# Patient Record
Sex: Male | Born: 1975 | Race: White | Hispanic: No | Marital: Married | Smoking: Former smoker
Health system: Southern US, Community
[De-identification: ages and names within clinical notes are randomized; demographics above are authoritative.]

## PROBLEM LIST (undated history)

## (undated) DIAGNOSIS — E119 Type 2 diabetes mellitus without complications: Secondary | ICD-10-CM

## (undated) DIAGNOSIS — I10 Essential (primary) hypertension: Secondary | ICD-10-CM

## (undated) HISTORY — DX: Essential (primary) hypertension: I10

## (undated) HISTORY — DX: Type 2 diabetes mellitus without complications: E11.9

---

## 2004-08-25 ENCOUNTER — Other Ambulatory Visit: Payer: Self-pay

## 2004-08-25 ENCOUNTER — Emergency Department: Payer: Self-pay | Admitting: Emergency Medicine

## 2007-08-09 ENCOUNTER — Ambulatory Visit: Payer: Self-pay | Admitting: Family Medicine

## 2007-08-20 ENCOUNTER — Ambulatory Visit (HOSPITAL_BASED_OUTPATIENT_CLINIC_OR_DEPARTMENT_OTHER): Admission: RE | Admit: 2007-08-20 | Discharge: 2007-08-20 | Payer: Self-pay | Admitting: Orthopedic Surgery

## 2008-06-11 ENCOUNTER — Ambulatory Visit: Payer: Self-pay | Admitting: Family Medicine

## 2009-04-24 HISTORY — PX: FRACTURE SURGERY: SHX138

## 2009-05-05 ENCOUNTER — Emergency Department: Payer: Self-pay | Admitting: Emergency Medicine

## 2009-05-10 ENCOUNTER — Ambulatory Visit: Payer: Self-pay | Admitting: Unknown Physician Specialty

## 2009-05-12 ENCOUNTER — Ambulatory Visit: Payer: Self-pay | Admitting: Unknown Physician Specialty

## 2010-09-06 NOTE — Op Note (Signed)
Kenneth Caldwell, Kenneth Caldwell                  ACCOUNT NO.:  [id number]   MEDICAL RECORD NO.:  [id number]          PATIENT TYPE:  AMB   LOCATION:  DSC                          FACILITY:  [facility]   PHYSICIAN:  Leonides Grills, M.D.     DATE OF BIRTH:  Nov 27, 1975   DATE OF PROCEDURE:  08/20/2007  DATE OF DISCHARGE:                               OPERATIVE REPORT   PREOPERATIVE DIAGNOSIS:  Right anterolateral and posterolateral ankle  impingement.   POSTOPERATIVE DIAGNOSIS:  Right anterolateral and posterolateral ankle  impingement.   OPERATION:  Right ankle arthroscopy with extensive debridement.   ANESTHESIA:  General.   SURGEON:  Leonides Grills, MD.   ASSISTANT:  Richardean Canal, PA-C.   ESTIMATED BLOOD LOSS:  Minimal.   TOURNIQUET:  None.   COMPLICATIONS:  None.   DISPOSITION:  Stable to PR.   INDICATIONS:  This 35 year old male who was injured at work and has had  persistent longstanding anterolateral and posterolateral ankle pain that  was interfering with his life.  He is consented for the above procedure.  All risks including infection or vessel injury, persistent pain  worsening pain, prolonged recovery, stiffness, arthritis were all  explained.  Questions were encouraged and answered.   OPERATION:  The patient was brought to the operating room, placed in  supine position after adequate general endotracheal tube anesthesia was  administered, as well as Ancef 1 g IV piggyback.  The patient was then  placed in a sloppy lateral position with the operative side up on a  beanbag.  All bony prominences were well padded.  The right lower  extremity was then prepped and draped in a sterile manner.  No  tourniquet was used.  Anatomical landmarks include anterior tibialis  tendon.  Peroneus tertius tendon were mapped out.  Superficial peroneal  nerve could not be seen.  Spinal needle was then placed just medial to  the anterior tibialis tendon and 20 mL of normal saline was instilled in  the  ankle.  The nick-and-spread technique was then utilized to create  the anteromedial portal and then a blunt tip trocar with cannula  followed by camera was placed in the ankle.  Direct visualization of the  anterolateral portal was created with a spinal needle followed by nick-  and-spread technique created lateral to the peroneus tertius tendon  illuminating the skin from inside out to make sure that the superficial  peroneal nerve was not in this interval.  There was a tremendous amount  of synovitis anterolaterally with a lot of scar tissue as well.  It  appears that there was a injury in this area and formed scar right over  the accessory tib-fib ligament area.  Synovitis extended into the  lateral gutter and anteriorly.  With a shaver as well as a  radiofrequency bevel, this was extensively debrided.  Once this was  debrided, we then placed the scope anterolaterally, visualizing  anteromedially and there was a small patch of synovitis in this area as  well which was also debrided.  The medial talar dome and medial gutter  were  sent to pathology.  With maximum dorsiflexion there was nothing  impinging anteriorly.  We then visualized posterolaterally and we were  able to visualize this without doing a posterolateral portal.  There was  a large flap of scar tissue from the syndesmotic recess that was  pinching the posterolateral aspect of the talar dome.  This was then  debrided with a shaver, as well as a radiofrequency bevel  range of  motion of the ankle, there was no impingement once this was done.  There  was actually a flap of tissue that was flapping in this area with a  patch of synovitis.  Once this was done, range of motion of the ankle  had no areas of impingement.  Tissues were obtained throughout the  procedure.  The camera was removed.  The wound was closed with 4-0 nylon  stitch.  Sterile dressing was applied.  CAM Walker boot was applied.  The patient was stable to the  PR.      Leonides Grills, M.D.  Electronically Signed     PB/MEDQ  D:  08/20/2007  T:  08/21/2007  Job:  [id number]

## 2011-01-17 LAB — BASIC METABOLIC PANEL
BUN: 10
Creatinine, Ser: 0.73
GFR calc non Af Amer: >60
Glucose, Bld: 132 — ABNORMAL HIGH
Potassium: 5.2 — ABNORMAL HIGH

## 2012-12-11 ENCOUNTER — Encounter: Payer: Self-pay | Admitting: General Surgery

## 2012-12-11 ENCOUNTER — Ambulatory Visit (INDEPENDENT_AMBULATORY_CARE_PROVIDER_SITE_OTHER): Payer: 59 | Admitting: General Surgery

## 2012-12-11 VITALS — BP 130/80 | HR 88 | Resp 14 | Ht 67.0 in | Wt 293.0 lb

## 2012-12-11 DIAGNOSIS — K645 Perianal venous thrombosis: Secondary | ICD-10-CM

## 2012-12-11 NOTE — Patient Instructions (Addendum)
May use Hemorrhoid cream as needed for an additional week

## 2012-12-11 NOTE — Progress Notes (Signed)
Patient ID: Kenneth Rakes., male   DOB: [birth year], 37 y.o.   MRN: [id number]  Chief Complaint  Patient presents with  . Rectal Problems    hemorrhoids    HPI Kenneth Offerdahl. is a 37 y.o. male.  Patient here today for evaluation of hemorrhoids. States he had some rectal pain on Monday and then noticed them bleeding worse on Tuesday like "on his legs and in toilet bowel".  He went to [facility] on Tuesday and was prescribed suppositories and pain medication.  States he is using the Tucks pads and witch hazel as well.  Has noticed some bleeding on the toilet tissue with the hemorrhoids in the past but never this bad. States he has BM 2-3 times a day which is normal. Denies constipation. States the bleeding is not as bad now.  HPI  Past Medical History  Diagnosis Date  . Diabetes mellitus without complication   . Hypertension     Past Surgical History  Procedure Laterality Date  . Fracture surgery  2011    leg    History reviewed. No pertinent family history.  Social History History  Substance Use Topics  . Smoking status: Former Smoker    Quit date: 04/25/1999  . Smokeless tobacco: Never Used  . Alcohol Use: Yes     Comment: 1-2/week    No Known Allergies  Current Outpatient Prescriptions  Medication Sig Dispense Refill  . ANUCORT-HC 25 MG suppository       . glipiZIDE (GLUCOTROL XL) 2.5 MG 24 hr tablet Take 2.5 mg by mouth daily.       Marland Kitchen lisinopril-hydrochlorothiazide (PRINZIDE,ZESTORETIC) 20-12.5 MG per tablet Take 1 tablet by mouth daily.       . pioglitazone (ACTOS) 15 MG tablet Take 15 mg by mouth daily.       No current facility-administered medications for this visit.    Review of Systems Review of Systems  Constitutional: Negative.   Respiratory: Negative.   Cardiovascular: Negative.   Gastrointestinal: Positive for anal bleeding and rectal pain.    Blood pressure 130/80, pulse 88, resp. rate 14, height 5\' 7"  (1.702 m), weight 293 lb (132.904  kg).  Physical Exam Physical Exam  Constitutional: He is oriented to person, place, and time. He appears well-developed and well-nourished.  Cardiovascular: Normal rate and regular rhythm.   Pulmonary/Chest: Effort normal and breath sounds normal.  Abdominal: Soft. No hernia.  Neurological: He is alert and oriented to person, place, and time.  Skin: Skin is warm and dry.  thrombosed hemorrhoid which has already drained on the right side. Digital exam is normal.  Data Reviewed    Assessment    Resolving thrombosed external hemorrhoid     Plan    OTC hemorrhoid cream for another week. No other treatment required        SANKAR,SEEPLAPUTHUR G 12/12/2012, 8:11 AM

## 2012-12-12 ENCOUNTER — Encounter: Payer: Self-pay | Admitting: General Surgery

## 2012-12-12 DIAGNOSIS — K645 Perianal venous thrombosis: Secondary | ICD-10-CM | POA: Insufficient documentation

## 2013-03-12 ENCOUNTER — Ambulatory Visit: Payer: Self-pay | Admitting: Surgery

## 2013-03-12 DIAGNOSIS — Z01810 Encounter for preprocedural cardiovascular examination: Secondary | ICD-10-CM

## 2013-03-12 LAB — MAGNESIUM: Magnesium: 2 mg/dL

## 2013-03-12 LAB — CBC WITH DIFFERENTIAL/PLATELET
Basophil %: 0.3 %
Eosinophil #: 0.1 10*3/uL (ref 0.0–0.7)
HGB: 14.3 g/dL (ref 13.0–18.0)
Lymphocyte #: 2.6 10*3/uL (ref 1.0–3.6)
MCV: 85 fL (ref 80–100)
Monocyte #: 0.6 x10 3/mm (ref 0.2–1.0)
Monocyte %: 7.8 %
Neutrophil #: 4.9 10*3/uL (ref 1.4–6.5)

## 2013-03-12 LAB — PROTIME-INR: INR: 1

## 2013-03-12 LAB — IRON AND TIBC
Iron Bind.Cap.(Total): 345 ug/dL (ref 250–450)
Iron: 59 ug/dL — ABNORMAL LOW (ref 65–175)
Unbound Iron-Bind.Cap.: 286 ug/dL

## 2013-03-12 LAB — COMPREHENSIVE METABOLIC PANEL
Alkaline Phosphatase: 97 U/L (ref 50–136)
Anion Gap: 7 (ref 7–16)
BUN: 14 mg/dL (ref 7–18)
Co2: 29 mmol/L (ref 21–32)
Creatinine: 0.67 mg/dL (ref 0.60–1.30)
Potassium: 3.7 mmol/L (ref 3.5–5.1)
SGOT(AST): 14 U/L — ABNORMAL LOW (ref 15–37)
SGPT (ALT): 19 U/L (ref 12–78)
Sodium: 138 mmol/L (ref 136–145)

## 2013-03-12 LAB — AMYLASE: Amylase: 40 U/L (ref 25–115)

## 2013-03-12 LAB — LIPASE, BLOOD: Lipase: 187 U/L (ref 73–393)

## 2013-03-12 LAB — HEMOGLOBIN A1C: Hemoglobin A1C: 10 % — ABNORMAL HIGH (ref 4.2–6.3)

## 2013-04-04 ENCOUNTER — Ambulatory Visit: Payer: Self-pay | Admitting: Surgery

## 2013-04-09 ENCOUNTER — Ambulatory Visit: Payer: Self-pay | Admitting: Surgery

## 2013-04-24 ENCOUNTER — Ambulatory Visit: Payer: Self-pay | Admitting: Surgery

## 2013-04-24 HISTORY — PX: LAPAROSCOPIC GASTRIC SLEEVE RESECTION: SHX5895

## 2013-12-24 ENCOUNTER — Telehealth: Payer: Self-pay | Admitting: *Deleted

## 2013-12-24 NOTE — Telephone Encounter (Signed)
error 

## 2014-12-01 ENCOUNTER — Other Ambulatory Visit: Payer: Self-pay | Admitting: Occupational Medicine

## 2014-12-01 ENCOUNTER — Ambulatory Visit: Payer: Self-pay

## 2014-12-01 DIAGNOSIS — M25561 Pain in right knee: Secondary | ICD-10-CM

## 2015-09-29 ENCOUNTER — Encounter: Payer: Self-pay | Admitting: Urology

## 2015-09-29 ENCOUNTER — Other Ambulatory Visit: Payer: Self-pay

## 2015-09-29 ENCOUNTER — Ambulatory Visit (INDEPENDENT_AMBULATORY_CARE_PROVIDER_SITE_OTHER): Payer: BLUE CROSS/BLUE SHIELD | Admitting: Urology

## 2015-09-29 VITALS — BP 143/88 | HR 75 | Ht 67.0 in | Wt 228.7 lb

## 2015-09-29 DIAGNOSIS — B379 Candidiasis, unspecified: Secondary | ICD-10-CM | POA: Diagnosis not present

## 2015-09-29 DIAGNOSIS — R310 Gross hematuria: Secondary | ICD-10-CM

## 2015-09-29 LAB — MICROSCOPIC EXAMINATION
Bacteria, UA: NONE SEEN
RBC, UA: >30 /hpf — AB (ref 0–?)
WBC, UA: NONE SEEN /hpf (ref 0–?)

## 2015-09-29 LAB — URINALYSIS, COMPLETE
Bilirubin, UA: NEGATIVE
Glucose, UA: NEGATIVE
LEUKOCYTES UA: NEGATIVE
Nitrite, UA: NEGATIVE
PH UA: 5.5 (ref 5.0–7.5)
Specific Gravity, UA: >1.03 — ABNORMAL HIGH (ref 1.005–1.030)
Urobilinogen, Ur: 2 mg/dL — ABNORMAL HIGH (ref 0.2–1.0)

## 2015-09-29 MED ORDER — FLUCONAZOLE 100 MG PO TABS: 100.0000 mg | ORAL_TABLET | Freq: Every day | ORAL | Status: DC

## 2015-09-29 NOTE — Addendum Note (Signed)
Addended by: Lonna CobbUSSELL, Layonna Dobie L on: 09/29/2015 10:04 AM   Modules accepted: Orders

## 2015-09-29 NOTE — Progress Notes (Signed)
09/29/2015 9:33 AM   Kenneth Beneavid P Aerts Jr. 1976/01/30 [id number]  Referring provider: Sherron MondayS Ahmed Tejan-Sie, MD [address] [place], [place] [address]  Chief Complaint  Patient presents with  . New Patient (Initial Visit)    hematuria, testicle pain x     HPI: The patient recently describes gross hematuria off and on. He may have had discoloration of the urine off and on after gastric sleeve surgery 2015. Intermittently he's had low and upper back pain on both sides. Intermittently he's had some pain in the penis proximal to the head of the penis. He had testicular pain a week ago. He has no dysuria and no increased frequency  He voids every 1-3 hours and is no nocturia. He used to smoke. He does not take daily blood thinners.  Modifying factors: There are no other modifying factors  Associated signs and symptoms: There are no other associated signs and symptoms Aggravating and relieving factors: There are no other aggravating or relieving factors Severity: Moderate Duration: Persistent   PMH: Past Medical History  Diagnosis Date  . Diabetes mellitus without complication (HCC)   . Hypertension     Surgical History: Past Surgical History  Procedure Laterality Date  . Fracture surgery  2011    leg  . Laparoscopic gastric sleeve resection  2015    Home Medications:    Medication List       This list is accurate as of: 09/29/15  9:33 AM.  Always use your most recent med list.               traMADol 50 MG tablet  Commonly known as:  ULTRAM  Take 50 mg by mouth every 6 (six) hours as needed.        Allergies: No Known Allergies  Family History: No family history on file.  Social History:  reports that he quit smoking about 16 years ago. He has never used smokeless tobacco. He reports that he drinks alcohol. He reports that he does not use illicit drugs.  ROS: UROLOGY Frequent Urination?: No Hard to postpone urination?: No Burning/pain with urination?: No Get  up at night to urinate?: No Leakage of urine?: No Urine stream starts and stops?: No Trouble starting stream?: Yes Do you have to strain to urinate?: Yes Blood in urine?: Yes Urinary tract infection?: No Sexually transmitted disease?: No Injury to kidneys or bladder?: No Painful intercourse?: No Weak stream?: No Erection problems?: No Penile pain?: No  Gastrointestinal Nausea?: No Vomiting?: No Indigestion/heartburn?: No Diarrhea?: No Constipation?: No     Physical Exam: BP 143/88 mmHg  Pulse 75  Ht 5\' 7"  (1.702 m)  Wt 228 lb 11.2 oz (103.738 kg)  BMI 35.81 kg/m2  Constitutional:  Alert and oriented, No acute distress. HEENT: [place] AT, moist mucus membranes.  Trachea midline, no masses. Cardiovascular: No clubbing, cyanosis, or edema. Respiratory: Normal respiratory effort, no increased work of breathing. GI: Abdomen is soft, nontender, nondistended, no abdominal masses GU: No CVA tenderness. The patient had a benign small prostate. Skin: No rashes, bruises or suspicious lesions. Lymph: No cervical or inguinal adenopathy. Neurologic: Grossly intact, no focal deficits, moving all 4 extremities. Psychiatric: Normal mood and affect.  Laboratory Data: Lab Results  Component Value Date   WBC 8.1 03/12/2013   HGB 14.3 03/12/2013   HCT 41.8 03/12/2013   MCV 85 03/12/2013   PLT 189 03/12/2013    Lab Results  Component Value Date   CREATININE 0.67 03/12/2013  No results found for: PSA  No results found for: TESTOSTERONE  Lab Results  Component Value Date   HGBA1C 10.0* 03/12/2013    Urinalysis No results found for: COLORURINE, APPEARANCEUR, LABSPEC, PHURINE, GLUCOSEU, HGBUR, BILIRUBINUR, KETONESUR, PROTEINUR, UROBILINOGEN, NITRITE, LEUKOCYTESUR  Pertinent Imaging: He had a CT scan of the abdomen and pelvis with and without contrast in the last few weeks and it was normal  Assessment & Plan:  The patient has hematuria. At baseline he has minimal voiding  dysfunction. He's had some penile pain and back pain and has had a normal CT scan. He has a past smoking history. I will send his urine for culture. I will empirically treated with ciprofloxacin. He'll come back for cystoscopy in 1-2 weeks.  The patient had yeast in the urine but no bacteria. I gave him Diflucan 100 mg daily for 3 days   1. Gross hematuria 2. Urinary tract infection 3. Penile pain - Urinalysis, Complete    Martina Sinner, MD  [facility] [address] [place], [place] [address] [phone]

## 2015-10-01 LAB — CULTURE, URINE COMPREHENSIVE

## 2015-10-04 ENCOUNTER — Ambulatory Visit (INDEPENDENT_AMBULATORY_CARE_PROVIDER_SITE_OTHER): Payer: BLUE CROSS/BLUE SHIELD | Admitting: Urology

## 2015-10-04 ENCOUNTER — Ambulatory Visit: Payer: Self-pay | Admitting: Urology

## 2015-10-04 ENCOUNTER — Encounter: Payer: Self-pay | Admitting: Urology

## 2015-10-04 VITALS — BP 159/90 | HR 82 | Ht 67.0 in | Wt 225.7 lb

## 2015-10-04 DIAGNOSIS — N3020 Other chronic cystitis without hematuria: Secondary | ICD-10-CM | POA: Insufficient documentation

## 2015-10-04 DIAGNOSIS — Z125 Encounter for screening for malignant neoplasm of prostate: Secondary | ICD-10-CM | POA: Diagnosis not present

## 2015-10-04 DIAGNOSIS — R310 Gross hematuria: Secondary | ICD-10-CM | POA: Diagnosis not present

## 2015-10-04 LAB — URINALYSIS, COMPLETE
Bilirubin, UA: NEGATIVE
Glucose, UA: NEGATIVE
Leukocytes, UA: NEGATIVE
NITRITE UA: NEGATIVE
Specific Gravity, UA: >1.03 — ABNORMAL HIGH (ref 1.005–1.030)
Urobilinogen, Ur: 1 mg/dL (ref 0.2–1.0)
pH, UA: 5.5 (ref 5.0–7.5)

## 2015-10-04 LAB — MICROSCOPIC EXAMINATION: BACTERIA UA: NONE SEEN

## 2015-10-04 MED ORDER — CIPROFLOXACIN HCL 500 MG PO TABS
500.0000 mg | ORAL_TABLET | Freq: Once | ORAL | Status: AC
  Administered 2015-10-04: 500 mg via ORAL

## 2015-10-04 MED ORDER — LIDOCAINE HCL 2 % EX GEL
1.0000 "application " | Freq: Once | CUTANEOUS | Status: AC
  Administered 2015-10-04: 1 via URETHRAL

## 2015-10-04 NOTE — Progress Notes (Signed)
9:30 AM   Kenneth Caldwell. [birth year] [id number]  Referring provider: Sherron Monday, MD [address] [place], [place] [address]  Chief Complaint  Patient presents with  . Cysto    gross hematuria    HPI:  1 - Gross Hematuria - new gross hematuria 2017. Referring CT w/o upper tract masses orhydro per report. Cysto 09/2015 normal. This resolved after treating incidental funguria 09/2015  2 - Funguria - pt with candidal funguria noted 09/2015, bacterial CX negative. Former diabetic, bu tnow of meds after gastric sleeve and 140lb weight loss. Some mild glycosuria noted. NO elevated PVR, hydr, or stones by eval 2017.   PM Hsig for anxiety, gastric sleeve.  Today "Kenneth Caldwell" is seen for cysto to complete hemtaria eval. He reports resolution of hematuria since treatment with diflucan.   T  PMH: Past Medical History  Diagnosis Date  . Diabetes mellitus without complication (HCC)   . Hypertension     Surgical History: Past Surgical History  Procedure Laterality Date  . Fracture surgery  2011    leg  . Laparoscopic gastric sleeve resection  2015    Home Medications:    Medication List       This list is accurate as of: 10/04/15  9:30 AM.  Always use your most recent med list.               fluconazole 100 MG tablet  Commonly known as:  DIFLUCAN  Take 1 tablet (100 mg total) by mouth daily. X 7 days     traMADol 50 MG tablet  Commonly known as:  ULTRAM  Take 50 mg by mouth every 6 (six) hours as needed.        Allergies: No Known Allergies  Family History: No family history on file.  Social History:  reports that he quit smoking about 16 years ago. He has never used smokeless tobacco. He reports that he drinks alcohol. He reports that he does not use illicit drugs.  ROS:   Gastrointestinal (upper)  : Negative for upper GI symptoms  Gastrointestinal (lower) : Negative for lower GI symptoms  Constitutional : Negative for symptoms  Skin: Negative  for skin symptoms  Eyes: Negative for eye symptoms  Ear/Nose/Throat : Negative for Ear/Nose/Throat symptoms  Hematologic/Lymphatic: Negative for Hematologic/Lymphatic symptoms  Cardiovascular : Negative for cardiovascular symptoms  Respiratory : Negative for respiratory symptoms  Endocrine: Negative for endocrine symptoms  Musculoskeletal: Negative for musculoskeletal symptoms  Neurological: Negative for neurological symptoms  Psychologic: Negative for psychiatric symptoms        Physical Exam: BP 159/90 mmHg  Pulse 82  Ht  (1.702 m)  Wt 225 lb 11.2 oz (102.377 kg)  BMI 35.34 kg/m2  Constitutional:  Alert and oriented, No acute distress. HEENT:  AT, moist mucus membranes.  Trachea midline, no masses. Cardiovascular: No clubbing, cyanosis, or edema. Respiratory: Normal respiratory effort, no increased work of breathing. GI: Abdomen is soft, nontender, nondistended, no abdominal masses GU: No CVA tenderness. The patient had a benign small prostate. Skin: No rashes, bruises or suspicious lesions. Lymph: No cervical or inguinal adenopathy. Neurologic: Grossly intact, no focal deficits, moving all 4 extremities. Psychiatric: Normal mood and affect.  Laboratory Data: Lab Results  Component Value Date   WBC 8.1 03/12/2013   HGB 14.3 03/12/2013   HCT 41.8 03/12/2013   MCV 85 03/12/2013   PLT 189 03/12/2013    Lab Results  Component Value Date   CREATININE 0.67 03/12/2013  No results found for: PSA  No results found for: TESTOSTERONE  Lab Results  Component Value Date   HGBA1C 10.0* 03/12/2013    Urinalysis    Component Value Date/Time   APPEARANCEUR Cloudy* 09/29/2015 0923   GLUCOSEU Negative 09/29/2015 0923   BILIRUBINUR Negative 09/29/2015 0923   PROTEINUR 1+* 09/29/2015 0923   NITRITE Negative 09/29/2015 0923   LEUKOCYTESUR Negative 09/29/2015 0923      Cystoscopy Procedure Note  Patient identification was confirmed,  informed consent was obtained, and patient was prepped using Betadine solution.  Lidocaine jelly was administered per urethral meatus.    Preoperative abx where received prior to procedure.     Pre-Procedure: - Inspection reveals a normal caliber ureteral meatus.  Procedure: The flexible cystoscope was introduced without difficulty - No urethral strictures/lesions are present. - Normal prostate . - Normal bladder neck - Bilateral ureteral orifices identified - Bladder mucosa  reveals no ulcers, tumors, or lesions - No bladder stones - No trabeculation  Retroflexion shows no additional findings   Post-Procedure: - Patient tolerated the procedure well     Pertinent Imaging: As per HPI  Assessment & Plan:    1 - Gross Hematuria - likely due to recent fungal UTI. Eval with exam, labs cysto, imaging otherwise reassuring. Reinforced need for repeat eval for future gross episodes only.   2 - Funguria - likely glycosuria / diabetes contribuitng. Reinforced need for ongoing glycemic control.  3 - RTC 1 year any provider, then prn if stable / no recurrent hematuria.   Sebastian AcheMANNY, Brinklee Cisse, MD  [facility] [address] [place], [place] [address] [phone]

## 2015-10-12 ENCOUNTER — Telehealth: Payer: Self-pay | Admitting: Urology

## 2015-10-12 NOTE — Telephone Encounter (Signed)
Spoke with patient.  He woke up this morning and his back was hurting on the right side and he had an episode of gross hematuria.  He has been at an amusement park and has spent the day walking, going on thrill rides and not taking in adequate water.  I encouraged him to increase his water intake, make better food choices and limit his exposure to thrill rides.  If he should become feverish or have dysuria or the pain in the kidney is unrelenting and severe, he needs to seek treatment in the ED.

## 2015-10-18 ENCOUNTER — Ambulatory Visit: Payer: BLUE CROSS/BLUE SHIELD | Admitting: Urology

## 2016-10-02 ENCOUNTER — Ambulatory Visit: Payer: BLUE CROSS/BLUE SHIELD

## 2017-04-05 IMAGING — CR DG KNEE COMPLETE 4+V*R*
4 series · 4 of 4 positions shown · non-contrast
Comparison: No priors.

CLINICAL DATA: 38-year-old male with twisting injury to the right
knee today complaining of right knee pain.

EXAM:
RIGHT KNEE - COMPLETE 4+ VIEW

[view not recorded (1 of 4)]
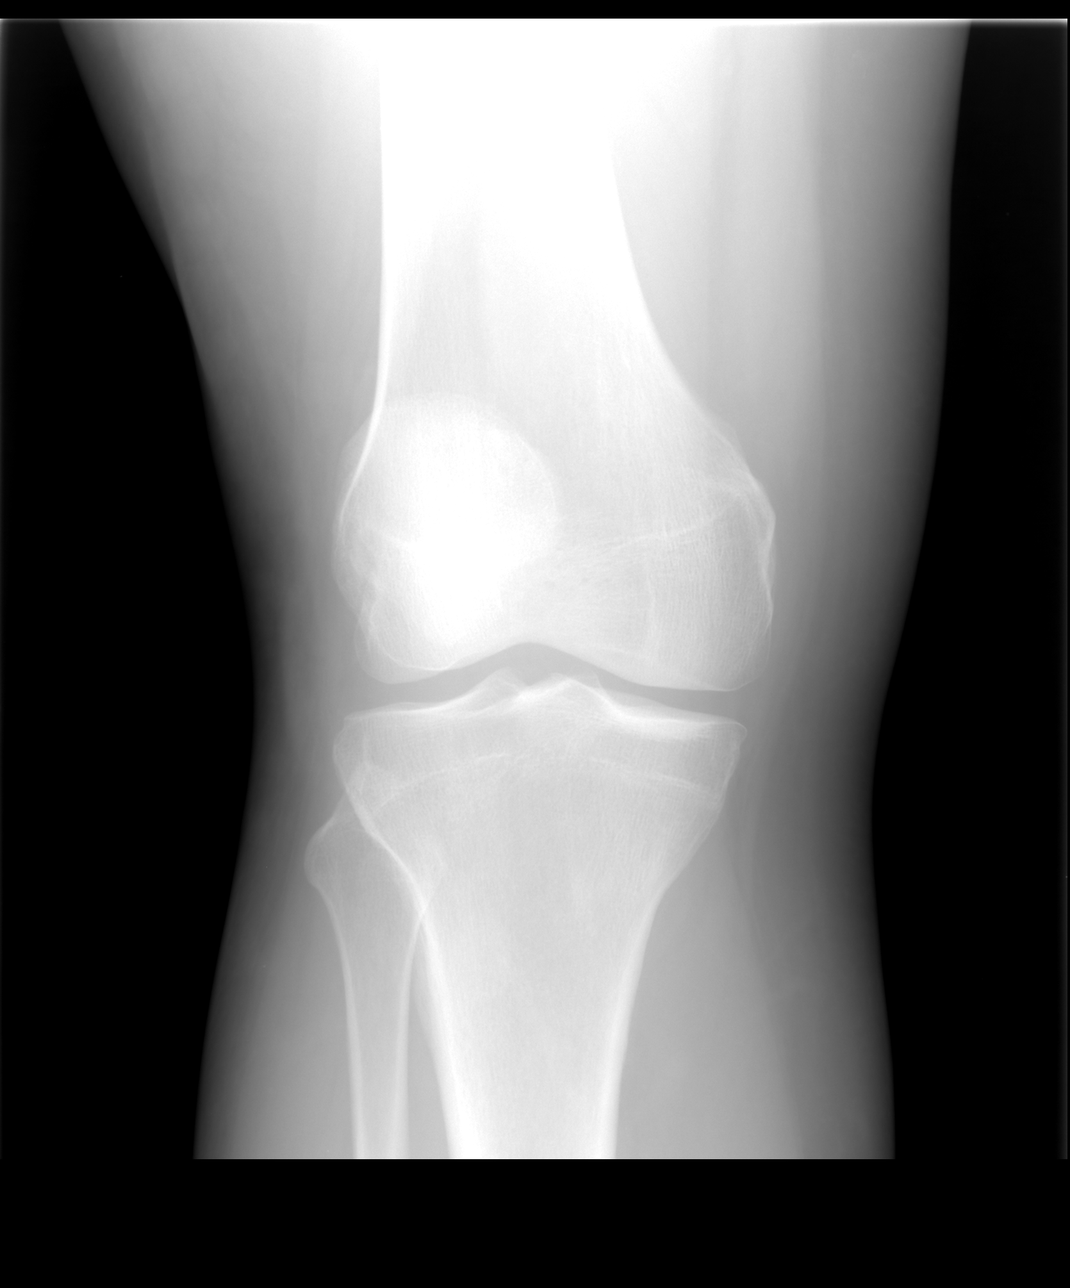

[view not recorded (2 of 4)]
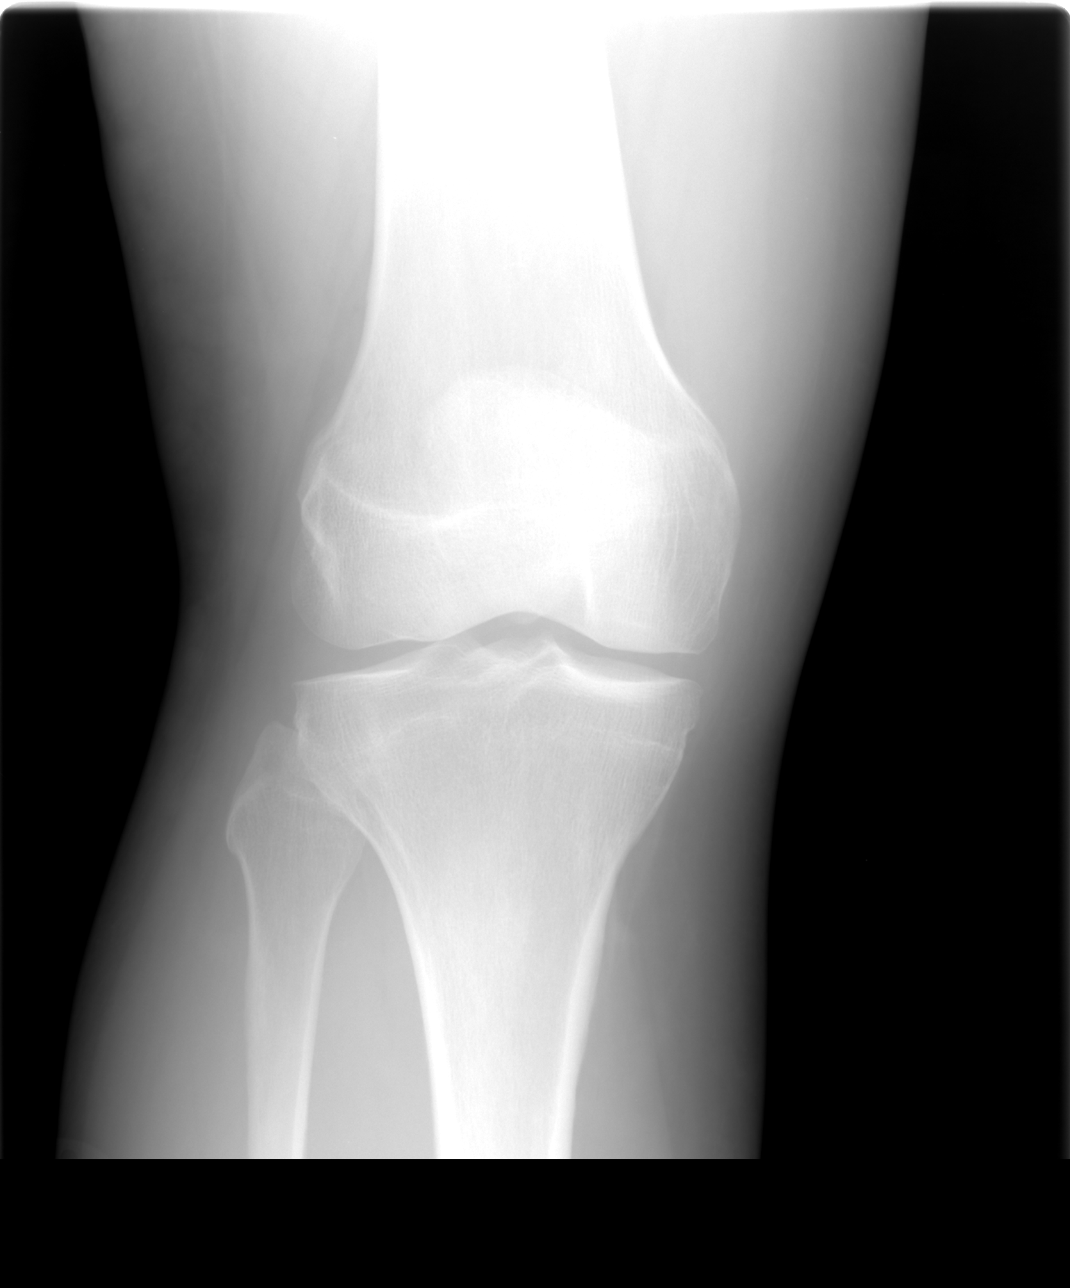

[view not recorded (3 of 4)]
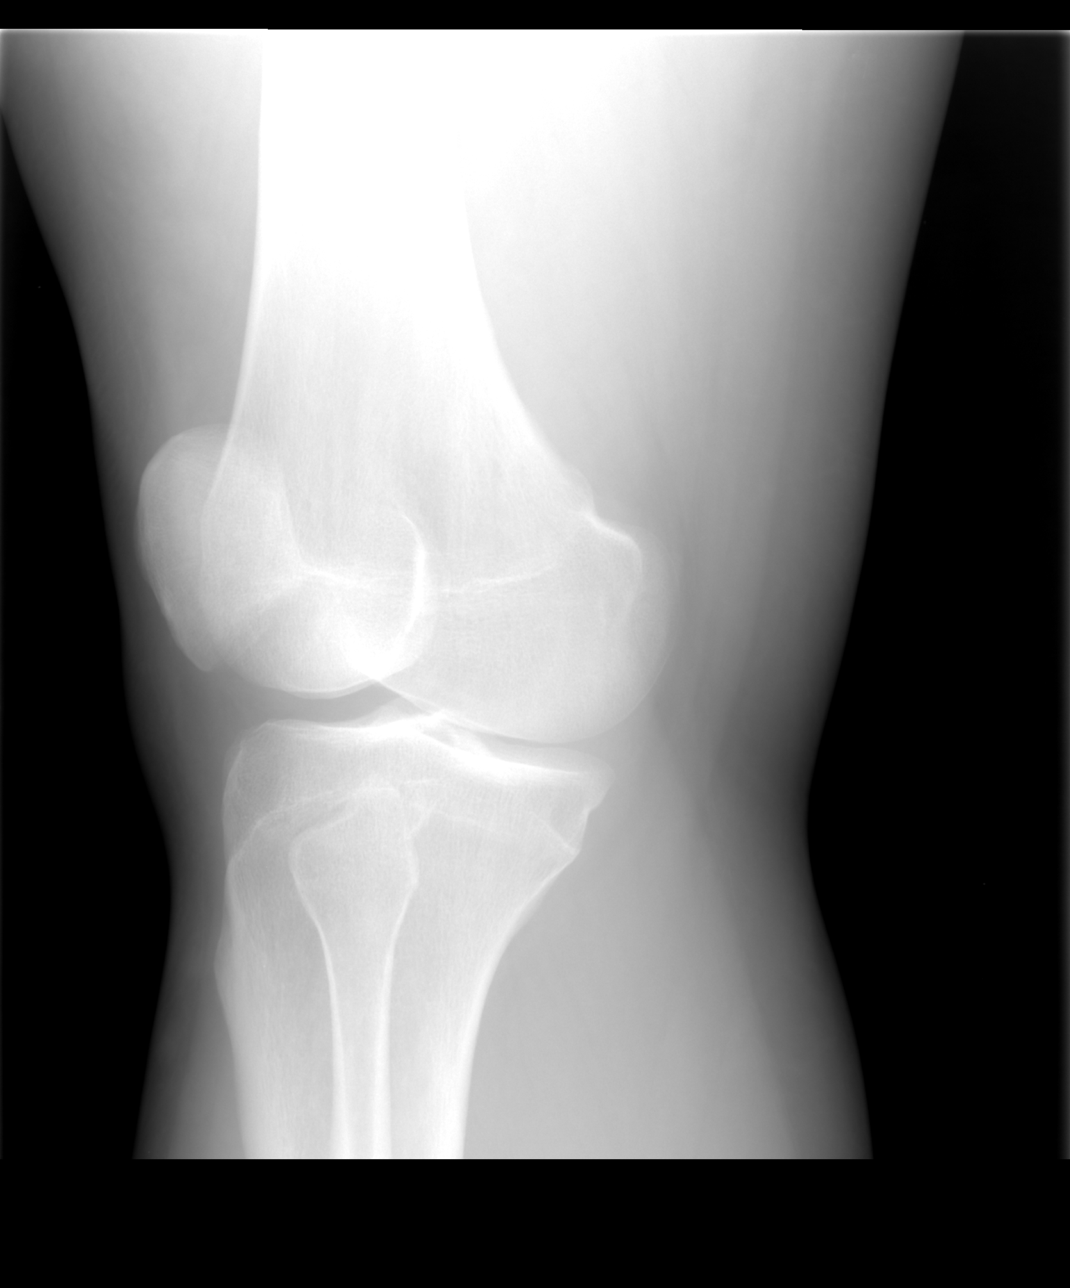

[view not recorded (4 of 4)]
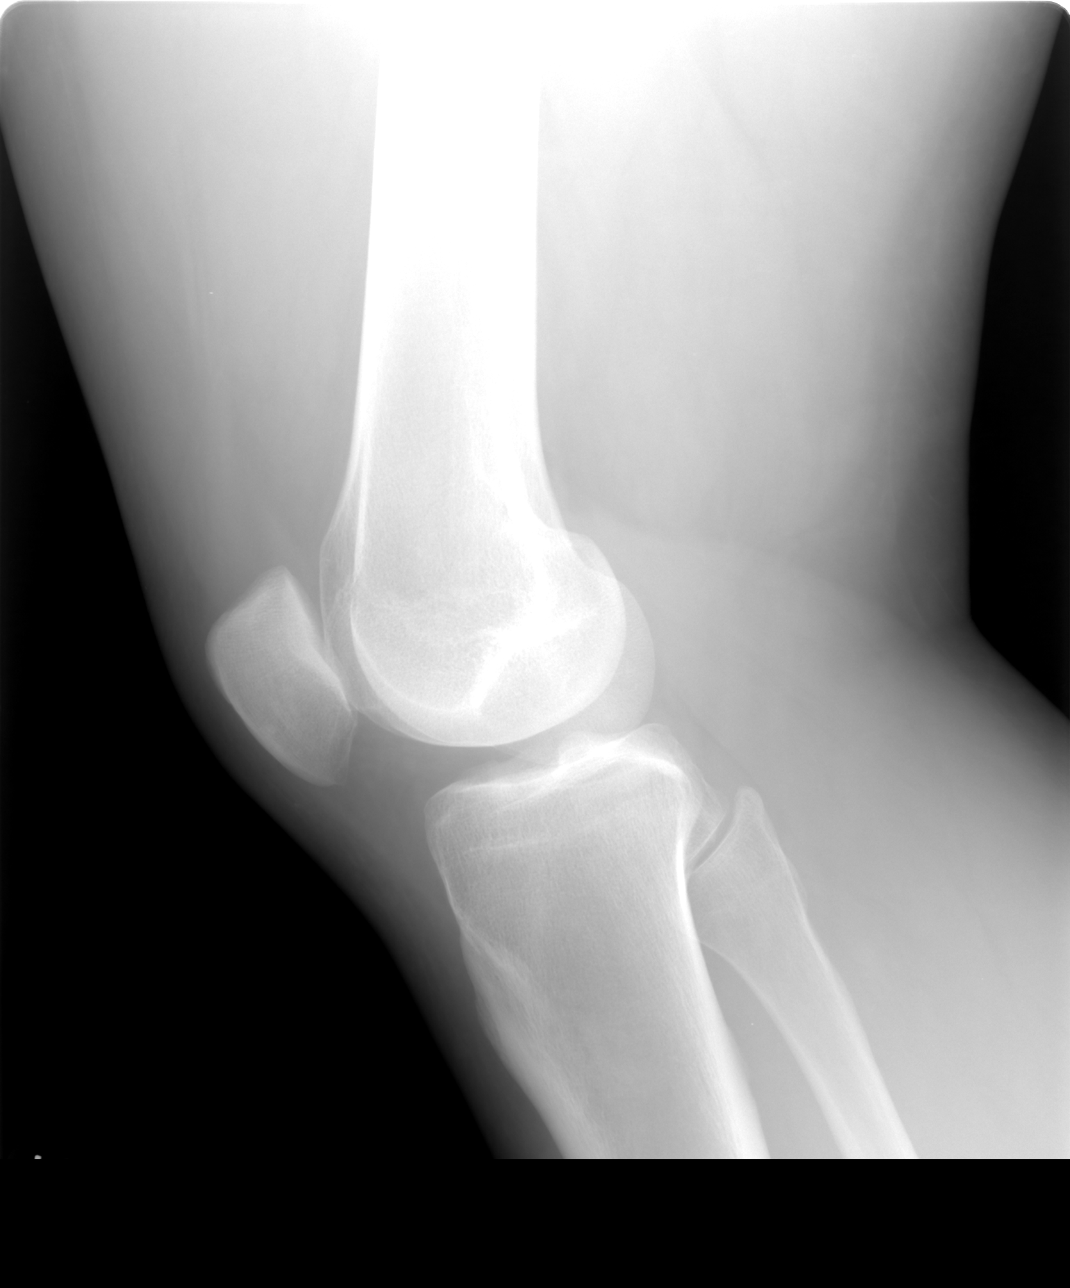

[4 of 4 positions shown; findings below may reference images not displayed]

FINDINGS: Multiple views of the right knee demonstrate no acute displaced
fracture, subluxation, dislocation, or soft tissue abnormality.
IMPRESSION: No acute radiographic abnormality of the right knee.

## 2019-05-13 ENCOUNTER — Ambulatory Visit: Payer: No Typology Code available for payment source | Attending: Internal Medicine

## 2019-05-13 DIAGNOSIS — Z20822 Contact with and (suspected) exposure to covid-19: Secondary | ICD-10-CM

## 2019-05-14 LAB — NOVEL CORONAVIRUS, NAA: SARS-CoV-2, NAA: DETECTED — AB

## 2019-05-15 ENCOUNTER — Ambulatory Visit: Payer: Self-pay

## 2019-05-15 NOTE — Telephone Encounter (Signed)
Provided Patient with covid testing result.  Patient  Voiced understanding.  Reviewed isolation protocol. Provided care advice . Reviewed Stopping  Isolation.  Patient voiced understanding.  HD will be notified.

## 2022-10-19 DIAGNOSIS — E119 Type 2 diabetes mellitus without complications: Secondary | ICD-10-CM | POA: Insufficient documentation

## 2024-02-12 ENCOUNTER — Ambulatory Visit

## 2024-02-12 VITALS — BP 182/110 | HR 72 | Ht 67.0 in | Wt 245.6 lb

## 2024-02-12 DIAGNOSIS — Z87891 Personal history of nicotine dependence: Secondary | ICD-10-CM | POA: Diagnosis not present

## 2024-02-12 DIAGNOSIS — E66812 Obesity, class 2: Secondary | ICD-10-CM | POA: Diagnosis not present

## 2024-02-12 DIAGNOSIS — E669 Obesity, unspecified: Secondary | ICD-10-CM | POA: Insufficient documentation

## 2024-02-12 DIAGNOSIS — I10 Essential (primary) hypertension: Secondary | ICD-10-CM | POA: Diagnosis not present

## 2024-02-12 DIAGNOSIS — Z903 Acquired absence of stomach [part of]: Secondary | ICD-10-CM | POA: Diagnosis not present

## 2024-02-12 DIAGNOSIS — Z6838 Body mass index (BMI) 38.0-38.9, adult: Secondary | ICD-10-CM

## 2024-02-12 MED ORDER — HYDROCHLOROTHIAZIDE 25 MG PO TABS: 25.0000 mg | ORAL_TABLET | Freq: Every day | ORAL | 3 refills | Status: AC

## 2024-02-12 NOTE — Progress Notes (Signed)
 New Patient Visit   Physician: Shanta Dorvil A Hakan Nudelman, MD  Patient: Kenneth Caldwell.   DOB: 03-Jul-1975   48 y.o. Male  MRN: [id number] Visit Date: 02/12/2024   Chief Complaint  Patient presents with   Establish Care   Subjective  Kenneth Monks. is a 48 y.o. male who presents today as a new patient to establish care.   HPI  Discussed the use of AI scribe software for clinical note transcription with the patient, who gave verbal consent to proceed.  History of Present Illness      History of Present Illness Kenneth Nolden. is a 48 year old male with hypertension who presents to establish.  Hypertension - Blood pressure readings consistently around 185/110 mmHg despite losartan 50 mg daily for the past six months - Previous antihypertensive medications include lisinopril and others - No current symptoms of chest pain, shortness of breath, or palpitations - Significant psychosocial stressors from work and family responsibilities  Obesity (BMI 38) - History of significant weight loss following bariatric surgery 8-9 years ago, influencing dietary habits.    - Does not exercise, diet could be improved  Paresthesia and muscle cramps - Numbness and tingling on the left side, particularly in fingers and leg, associated with certain positions - Muscle cramps, especially after work, attributed to inadequate water intake  Genitourinary symptoms - History of fungal bladder infection - Past episode of hematuria, treated as an infection - No current dysuria, frequency, or urgency reported  Olfactory and gustatory dysfunction - Persistent loss of smell and taste since COVID-19 infection in 2020   A    ASSESSMENT & PLAN  Encounter Diagnoses  Name Primary?   Hypertension, unspecified type Yes   Former smoker    Class 2 severe obesity due to excess calories with serious comorbidity and body mass index (BMI) of 38.0 to 38.9 in adult    H/O gastric sleeve     Orders Placed  This Encounter  Procedures   CBC with Differential/Platelet   Comprehensive metabolic panel with GFR   Hemoglobin A1c   Lipid panel   Urinalysis, Routine w reflex microscopic   B12 and Folate Panel   PSA    Assessment and Plan    Hypertension Chronic hypertension with blood pressure at 182/110 mmHg despite losartan 50 mg daily. Stress and lifestyle factors may contribute. - Add hydrochlorothiazide 25 mg to losartan regimen. - Instruct to monitor blood pressure at home, recording at least ten values at different times of the day. - Set goal blood pressure to 130/80 mmHg or less.  High stress levels likely contributing to elevated blood pressure and health concerns. - Encourage personal relaxation and stress management, such as taking a few hours on Sundays for personal time.  Left-sided numbness and muscle cramps Intermittent numbness and tingling on the left side, possibly due to positioning or pinched nerve. Will discuss at latter visits in detail if a concern.  Muscle cramps likely from dehydration or electrolyte imbalance. - Recommend magnesium supplementation at bedtime for muscle cramps. - Encourage adequate hydration and stretching before bedtime.  Chronic back pain Chronic back pain possibly exacerbated by sedentary work and physical labor. Avoids pain medication due to preference. - Recommend Tylenol for pain management as needed.  Possible diabetes mellitus - in remission? Unclear diabetes status. History suggests possible diabetes. - Order hemoglobin A1c to assess current diabetes status.  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  General Health Maintenance Discussed importance of regular screenings and lifestyle modifications. - Order comprehensive blood work including blood counts, cholesterol, and PSA screening. - Advise to return for fasting blood work.  - Declines  vaccines   F/u in 3-4 weeks to review labs         Objective  BP (!) 182/110   Pulse 72   Ht 5' 7 (1.702 m)   Wt 245 lb 9.6 oz (111.4 kg)   SpO2 99%   BMI 38.47 kg/m      Review of Systems  Constitutional:  Negative for chills, fever and weight loss.  Eyes:  Negative for blurred vision. h Respiratory:  Negative for cough and shortness of breath.   Cardiovascular:  Negative for chest pain and palpitations.  Skin:  Negative for rash.  Psychiatric/Behavioral:  Negative for depression. The patient is not nervous/anxious.      Physical Exam Physical Exam Vitals reviewed.  Constitutional:      Appearance: Normal appearance. Well-developed, obese HENT:     Head: Normocephalic and atraumatic.  Normal mucous membranes, no oral lesions Eyes:     Pupils: Pupils are equal, round, and reactive to light.  Neck:     Thyroid: No thyroid mass or thyromegaly.  Cardiovascular:     Rate and Rhythm: Normal rate and regular rhythm. Normal heart sounds. Normal peripheral pulses Pulmonary:     Normal breath sounds with normal effort Abdominal:   Abdomen is soft, without tenderness or noted hepatosplenomegaly Musculoskeletal:        General: No swelling or edema  Lymphadenopathy:     Cervical: No cervical adenopathy.  Skin:    General: Skin is warm and dry without noticeable rash. Neurological:     General: No focal deficit present.  Psychiatric:        Mood and Affect: Mood, behavior and cognition normal   Past Medical History:  Diagnosis Date   Diabetes mellitus without complication (HCC)    Hypertension    Past Surgical History:  Procedure Laterality Date   FRACTURE SURGERY  2011   leg   LAPAROSCOPIC GASTRIC SLEEVE RESECTION  2015   Family Status  Relation Name Status   Mother  Alive   Father  Deceased   Brother  Alive  No partnership data on file   History reviewed. No pertinent family history. Social History   Socioeconomic History   Marital status:  Married    Spouse name: Not on file   Number of children: Not on file   Years of education: Not on file   Highest education level: Not on file  Occupational History   Not on file  Tobacco Use   Smoking status: Former    Current packs/day: 0.00    Types: Cigarettes    Quit date: 04/25/1999    Years since quitting: 24.8   Smokeless tobacco: Never  Substance and Sexual Activity   Alcohol use: Yes    Comment: 1-2/week   Drug use: No   Sexual activity: Not on file  Other Topics Concern   Not on file  Social History Narrative   Not on file   Social Drivers of Health   Financial Resource Strain: Not on file  Food Insecurity: Not on file  Transportation Needs: Not on file  Physical Activity: Not on file  Stress: Not on file  Social Connections: Not on file   Outpatient Medications Prior to Visit  Medication Sig   losartan (  COZAAR) 50 MG tablet Take 50 mg by mouth daily.   [DISCONTINUED] fluconazole  (DIFLUCAN ) 100 MG tablet Take 1 tablet (100 mg total) by mouth daily. X 7 days   [DISCONTINUED] traMADol (ULTRAM) 50 MG tablet Take 50 mg by mouth every 6 (six) hours as needed. (Patient not taking: Reported on 02/12/2024)   No facility-administered medications prior to visit.   No Known Allergies   There is no immunization history on file for this patient.  Health Maintenance  Topic Date Due   HIV Screening  Never done   Hepatitis C Screening  Never done   DTaP/Tdap/Td (1 - Tdap) Never done   Pneumococcal Vaccine (1 of 2 - PCV) Never done   Hepatitis B Vaccines 19-59 Average Risk (1 of 3 - 19+ 3-dose series) Never done   Colonoscopy  Never done   Influenza Vaccine  Never done   COVID-19 Vaccine (1 - 2025-26 season) Never done   HPV VACCINES  Aged Out   Meningococcal B Vaccine  Aged Out    Patient Care Team: Kenneth Caldwell LABOR, MD as PCP - General (Family Medicine) Dellie Louanne MATSU, MD (General Surgery) Vonzell Lynwood VEAR Caldwell., MD (Inactive) (Family  Medicine)  Depression Screen     No data to display           Caldwell LABOR Everlene, MD  [facility] [phone] (phone) [phone] (fax)  [facility]

## 2024-03-03 ENCOUNTER — Other Ambulatory Visit

## 2024-03-03 DIAGNOSIS — Z87891 Personal history of nicotine dependence: Secondary | ICD-10-CM

## 2024-03-03 DIAGNOSIS — I10 Essential (primary) hypertension: Secondary | ICD-10-CM

## 2024-03-04 LAB — URINALYSIS, ROUTINE W REFLEX MICROSCOPIC
Bilirubin Urine: NEGATIVE
Glucose, UA: NEGATIVE
Hgb urine dipstick: NEGATIVE
Ketones, ur: NEGATIVE
Leukocytes,Ua: NEGATIVE
Nitrite: NEGATIVE
Protein, ur: NEGATIVE
Specific Gravity, Urine: 1.02 (ref 1.001–1.035)
pH: 8 (ref 5.0–8.0)

## 2024-03-04 LAB — LIPID PANEL
Cholesterol: 150 mg/dL (ref <200)
HDL: 45 mg/dL (ref >=40)
LDL Cholesterol (Calc): 88 mg/dL
Non-HDL Cholesterol (Calc): 105 mg/dL (ref <130)
Total CHOL/HDL Ratio: 3.3 (calc) (ref <5.0)
Triglycerides: 77 mg/dL (ref <150)

## 2024-03-04 LAB — B12 AND FOLATE PANEL
Folate: 3.9 ng/mL — ABNORMAL LOW
Vitamin B-12: 254 pg/mL (ref 200–1100)

## 2024-03-04 LAB — COMPREHENSIVE METABOLIC PANEL WITH GFR
AG Ratio: 1.9 (calc) (ref 1.0–2.5)
ALT: 6 U/L — ABNORMAL LOW (ref 9–46)
AST: 14 U/L (ref 10–40)
Albumin: 4.7 g/dL (ref 3.6–5.1)
Alkaline phosphatase (APISO): 63 U/L (ref 36–130)
BUN: 14 mg/dL (ref 7–25)
CO2: 30 mmol/L (ref 20–32)
Calcium: 9.8 mg/dL (ref 8.6–10.3)
Chloride: 100 mmol/L (ref 98–110)
Creat: 0.92 mg/dL (ref 0.60–1.29)
Globulin: 2.5 g/dL (ref 1.9–3.7)
Glucose, Bld: 131 mg/dL — ABNORMAL HIGH (ref 65–99)
Potassium: 3.7 mmol/L (ref 3.5–5.3)
Sodium: 139 mmol/L (ref 135–146)
Total Bilirubin: 0.8 mg/dL (ref 0.2–1.2)
Total Protein: 7.2 g/dL (ref 6.1–8.1)
eGFR: 103 mL/min/1.73m2 (ref >=60)

## 2024-03-04 LAB — CBC WITH DIFFERENTIAL/PLATELET
Absolute Lymphocytes: 1647 {cells}/uL (ref 850–3900)
Absolute Monocytes: 459 {cells}/uL (ref 200–950)
Basophils Absolute: 22 {cells}/uL (ref 0–200)
Basophils Relative: 0.4 %
Eosinophils Absolute: 81 {cells}/uL (ref 15–500)
Eosinophils Relative: 1.5 %
HCT: 44.5 % (ref 38.5–50.0)
Hemoglobin: 14.7 g/dL (ref 13.2–17.1)
MCH: 29.4 pg (ref 27.0–33.0)
MCHC: 33 g/dL (ref 32.0–36.0)
MCV: 89 fL (ref 80.0–100.0)
MPV: 11 fL (ref 7.5–12.5)
Monocytes Relative: 8.5 %
Neutro Abs: 3191 {cells}/uL (ref 1500–7800)
Neutrophils Relative %: 59.1 %
Platelets: 201 Thousand/uL (ref 140–400)
RBC: 5 Million/uL (ref 4.20–5.80)
RDW: 13.3 % (ref 11.0–15.0)
Total Lymphocyte: 30.5 %
WBC: 5.4 Thousand/uL (ref 3.8–10.8)

## 2024-03-04 LAB — HEMOGLOBIN A1C
Hgb A1c MFr Bld: 6.3 % — ABNORMAL HIGH (ref <5.7)
Mean Plasma Glucose: 134 mg/dL
eAG (mmol/L): 7.4 mmol/L

## 2024-03-04 LAB — PSA: PSA: 0.74 ng/mL (ref <=4.00)

## 2024-03-10 ENCOUNTER — Ambulatory Visit

## 2024-03-10 VITALS — BP 138/90 | HR 87 | Ht 67.0 in | Wt 240.4 lb

## 2024-03-10 DIAGNOSIS — E538 Deficiency of other specified B group vitamins: Secondary | ICD-10-CM | POA: Diagnosis not present

## 2024-03-10 DIAGNOSIS — E119 Type 2 diabetes mellitus without complications: Secondary | ICD-10-CM

## 2024-03-10 DIAGNOSIS — I10 Essential (primary) hypertension: Secondary | ICD-10-CM

## 2024-03-10 DIAGNOSIS — E11A Type 2 diabetes mellitus without complications in remission: Secondary | ICD-10-CM

## 2024-03-10 DIAGNOSIS — Z903 Acquired absence of stomach [part of]: Secondary | ICD-10-CM | POA: Diagnosis not present

## 2024-03-10 NOTE — Progress Notes (Signed)
 Progress Note  Physician: Megin Consalvo A Bradyn Soward, MD   HPI: Kenneth Caldwell. is a 48 y.o. male presenting on 03/10/2024 for Follow-up .  Discussed the use of AI scribe software for clinical note transcription with the patient, who gave verbal consent to proceed.  History of Present Illness   Kenneth Caldwell. is a 48 year old male with a history of gastric sleeve and hypertension who presents for follow-up on lab results.  Folate deficiency - Recent laboratory results show a low folate level of 3.9 ng/mL - Vitamin B12 level is within normal limits - Hemoglobin A1c is 6.3% - History of gastric sleeve procedure - Does not consistently take a multivitamin  Hypertension - History of hypertension - Currently taking hydrochlorothiazide 25 mg daily and losartan 50 mg daily - Home blood pressure readings typically range from 138/95 to 138/100 mmHg - Recent home blood pressure reading of 156/98 mmHg prior to taking medication - Uses a wrist cuff for blood pressure measurements, which may be less accurate - No dizziness or lightheadedness  Glycemic control - Past diagnosis of diabetes mellitus - Previous hemoglobin A1c was 10% several years ago - Significant decrease in A1c following gastric sleeve procedure and weight loss - Current hemoglobin A1c is 6.3% - Not currently following a diabetic diet, does not tolerate metformin - Frequent consumption of high-sugar foods, including Oreos - Busy work schedule, including physically demanding activities  - Difficulty balancing protein intake due to economic constraints - Frequent consumption of fatty meats     Medical history:  Relevant past medical, surgical, family and social history reviewed and updated as indicated. Interim medical history since our last visit reviewed.  Allergies and medications reviewed and updated.   ROS: Negative unless specifically indicated above in HPI.    Current Outpatient Medications:     hydrochlorothiazide (HYDRODIURIL) 25 MG tablet, Take 1 tablet (25 mg total) by mouth daily., Disp: 90 tablet, Rfl: 3   losartan (COZAAR) 50 MG tablet, Take 50 mg by mouth daily., Disp: , Rfl:        Objective:     BP (!) 138/90   Pulse 87   Ht 5' 7 (1.702 m)   Wt 240 lb 6.4 oz (109 kg)   SpO2 98%   BMI 37.65 kg/m   Wt Readings from Last 3 Encounters:  03/10/24 240 lb 6.4 oz (109 kg)  02/12/24 245 lb 9.6 oz (111.4 kg)  10/04/15 225 lb 11.2 oz (102.4 kg)    Physical Exam  Physical Exam Vitals reviewed.  Constitutional:      Appearance: Normal appearance. Well-developed with normal weight.  Cardiovascular:     Rate and Rhythm: Normal rate and regular rhythm. Normal heart sounds. Normal peripheral pulses Pulmonary:     Normal breath sounds with normal effort Skin:    General: Skin is warm and dry without noticeable rash. Neurological:     General: No focal deficit present.  Psychiatric:        Mood and Affect: Mood, behavior and cognition normal      Assessment & Plan:   Encounter Diagnoses  Name Primary?   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 Folate level is low at 3.9. B12 levels are normal. Importance of folate for nerve function and blood cell production discussed. - Take a multivitamin with folate daily.  Hypertension Blood pressure readings at home are elevated, with a recent reading of 138/90 mmHg. Current medications include hydrochlorothiazide 25 mg daily and  losartan 50 mg daily. Wrist cuff may not be accurate.  Goal is to maintain blood pressure below 130/80 mmHg. - Increased losartan to 100 mg daily, - Monitor blood pressure at home and maintain a log - Submit blood pressure log before the end of the month.  Type 2 diabetes mellitus in remission Hemoglobin A1c is 6.3, indicating prediabetes. Previously diagnosed with diabetes with an A1c of 10, now in remission post-gastric sleeve surgery. Emphasis on dietary modifications to maintain remission and prevent progression to diabetes. - Reduce intake of processed foods and sugars, particularly in the evening. - Focus on a diet rich in fruits, lean proteins, and whole grains. - Will schedule follow-up labs before March to reassess blood sugar levels.

## 2024-05-13 ENCOUNTER — Telehealth: Payer: Self-pay

## 2024-05-13 NOTE — Telephone Encounter (Signed)
 Copied from CRM [id number]. Topic: Clinical - Lab/Test Results >> May 13, 2024 11:37 AM Emylou G wrote: Reason for CRM: Patient called.. said the physical appt is required by his work.. and he is wanting labs to be done as well.. ( there are asking the appt to be coded as preventative care for his ins/work )  Do we need an appt in advance for labs or can be done same day?  Pls call patient?

## 2024-05-29 ENCOUNTER — Encounter

## 2024-06-09 ENCOUNTER — Ambulatory Visit: Admitting: Family Medicine

## 2024-06-16 ENCOUNTER — Other Ambulatory Visit

## 2024-06-23 ENCOUNTER — Ambulatory Visit
